# Patient Record
Sex: Female | Born: 1995 | Race: White | Hispanic: No | Marital: Single
Health system: Southern US, Community
[De-identification: ages and names within clinical notes are randomized; demographics above are authoritative.]

---

## 2017-02-14 ENCOUNTER — Encounter (HOSPITAL_COMMUNITY): Payer: Self-pay | Admitting: Emergency Medicine

## 2017-02-14 ENCOUNTER — Emergency Department (HOSPITAL_COMMUNITY): Payer: BLUE CROSS/BLUE SHIELD

## 2017-02-14 ENCOUNTER — Emergency Department (HOSPITAL_COMMUNITY)
Admission: EM | Admit: 2017-02-14 | Discharge: 2017-02-14 | Disposition: A | Discharge status: Home / Self Care | Payer: BLUE CROSS/BLUE SHIELD | Attending: Emergency Medicine | Admitting: Emergency Medicine

## 2017-02-14 DIAGNOSIS — S6992XA Unspecified injury of left wrist, hand and finger(s), initial encounter: Secondary | ICD-10-CM | POA: Diagnosis present

## 2017-02-14 DIAGNOSIS — S62617A Displaced fracture of proximal phalanx of left little finger, initial encounter for closed fracture: Secondary | ICD-10-CM | POA: Diagnosis not present

## 2017-02-14 DIAGNOSIS — Y999 Unspecified external cause status: Secondary | ICD-10-CM | POA: Diagnosis not present

## 2017-02-14 DIAGNOSIS — Y9389 Activity, other specified: Secondary | ICD-10-CM | POA: Insufficient documentation

## 2017-02-14 DIAGNOSIS — Y9289 Other specified places as the place of occurrence of the external cause: Secondary | ICD-10-CM | POA: Diagnosis not present

## 2017-02-14 DIAGNOSIS — W228XXA Striking against or struck by other objects, initial encounter: Secondary | ICD-10-CM | POA: Diagnosis not present

## 2017-02-14 MED ORDER — HYDROCODONE-ACETAMINOPHEN 5-325 MG PO TABS: 1.0000 | ORAL_TABLET | Freq: Four times a day (QID) | ORAL | 0 refills | Status: AC | PRN

## 2017-02-14 MED ORDER — HYDROCODONE-ACETAMINOPHEN 5-325 MG PO TABS
1.0000 | ORAL_TABLET | Freq: Once | ORAL | Status: AC
  Administered 2017-02-14: 1 via ORAL
  Filled 2017-02-14: qty 1

## 2017-02-14 MED ORDER — IBUPROFEN 400 MG PO TABS
600.0000 mg | ORAL_TABLET | Freq: Once | ORAL | Status: AC
  Administered 2017-02-14: 600 mg via ORAL
  Filled 2017-02-14: qty 2

## 2017-02-14 MED ORDER — IBUPROFEN 600 MG PO TABS: 600.0000 mg | ORAL_TABLET | Freq: Four times a day (QID) | ORAL | 0 refills | Status: AC | PRN

## 2017-02-14 NOTE — Discharge Instructions (Signed)
Elevate your finger. Use ice packs to keep the swelling down. Wear the finger splint to support your fracture. You need to see an orthopedist this week to reevaluate your fracture, you may need surgery to help it heal properly. You have a "Minimally displaced, oblique, extra-articular fracture of the left fifth proximal phalanx. Take the ibuprofen for pain, use the hydrocodone for severe pain (keeping you awake at night).

## 2017-02-14 NOTE — ED Provider Notes (Addendum)
[facility] DEPT Provider Note   CSN: [id number] Arrival date & time: 02/14/17  4098  Time seen 03:50 AM   History   Chief Complaint Chief Complaint  Patient presents with  . Finger Injury    HPI Marionette Mcmillen is a 21 y.o. female.  HPI  patient states around midnight she was in the pool. She states someone was afraid of the water and was coming down the water slide and they were at the end to try to catch her and somehow she got her left little finger injury. She states she is right-handed. She states she has some tingling in her left finger. She states it's swollen and it hurts when she tries to move it. She denies any other injury.  PCP none Family Orthopedist Dr Abran Duke in [place], [place]  History reviewed. No pertinent past medical history.  There are no active problems to display for this patient.   History reviewed. No pertinent surgical history.  OB History    No data available       Home Medications    Prior to Admission medications   Medication Sig Start Date End Date Taking? Authorizing Provider  HYDROcodone-acetaminophen (NORCO/VICODIN) 5-325 MG tablet Take 1 tablet by mouth every 6 (six) hours as needed for severe pain. 02/14/17   Devoria Albe, MD  ibuprofen (ADVIL,MOTRIN) 600 MG tablet Take 1 tablet (600 mg total) by mouth every 6 (six) hours as needed. 02/14/17   Devoria Albe, MD    Family History No family history on file.  Social History Social History  Substance Use Topics  . Smoking status: Not on file  . Smokeless tobacco: Not on file  . 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   Review of Systems Review of Systems  All other systems reviewed and are negative.    Physical Exam Updated Vital Signs BP 127/86 (BP Location: Left Arm)   Pulse 97   Temp 97.6 F (36.4 C) (Oral)   Resp 18   SpO2 100%   Physical Exam    Constitutional: She is oriented to person, place, and time. She appears well-developed and well-nourished.  HENT:  Head: Normocephalic and atraumatic.  Right Ear: External ear normal.  Left Ear: External ear normal.  Nose: Nose normal.  Eyes: Conjunctivae and EOM are normal.  Neck: Normal range of motion.  Cardiovascular: Normal rate.   Pulmonary/Chest: Effort normal.  Musculoskeletal: She exhibits edema, tenderness and deformity.  Patient has obvious diffuse swelling of her left little finger with some bruising on the volar aspect near the MCP joint. She prefers to keep her finger flexed but she is able to obtain almost full extension slowly. Sensation is intact, pulses are intact. Capillary refills normal.  Neurological: She is alert and oriented to person, place, and time. No cranial nerve deficit.  Skin: Skin is warm and dry.  Psychiatric: She has a normal mood and affect. Her behavior is normal. Thought content normal.     ED Treatments / Results  Labs (all labs ordered are listed, but only abnormal results are displayed) Labs Reviewed - No data to display  EKG  EKG Interpretation None       Radiology Dg Hand Complete Left  Result Date: 02/14/2017 CLINICAL DATA:  Left little finger injury EXAM: LEFT HAND - COMPLETE 3+ VIEW COMPARISON:  None. FINDINGS: There are is an oblique, extra-articular  fracture of the left fifth proximal phalanx with associated soft tissue swelling. There is slight medial displacement. IMPRESSION: Minimally displaced, oblique, extra-articular fracture of the left fifth proximal phalanx. Electronically Signed   By: Deatra RobinsonKevin  Herman M.D.   On: 02/14/2017 04:18    Procedures Procedures (including critical care time)  Medications Ordered in ED Medications  HYDROcodone-acetaminophen (NORCO/VICODIN) 5-325 MG per tablet 1 tablet (not administered)  ibuprofen (ADVIL,MOTRIN) tablet 600 mg (not administered)     Initial Impression / Assessment and Plan /  ED Course  I have reviewed the triage vital signs and the nursing notes.  Pertinent labs & imaging results that were available during my care of the patient were reviewed by me and considered in my medical decision making (see chart for details).  We discussed patient's x-ray results. I'm concerned that this is an oblique fracture and there is some displacement, she may need further evaluation by a orthopedist and possibly pin placement to stabilize the fracture. Family has a orthopedists in [place], I also gave them the number for a hand specialist. She was placed in a finger splint, she is to keep it elevated and use ice packs. She was given hydrocodone for pain however she was advised to take the ibuprofen mainly for her pain control and use the hydrocodone just for pain that is keeping her awake at night.   Please note that patient and her mother disagreed about her going back to work. Patient wants to go to work with limited duty, her mother wants her to stay out of work. Patient was given a note for limited duty at work.  Final Clinical Impressions(s) / ED Diagnoses   Final diagnoses:  Closed displaced fracture of proximal phalanx of left little finger, initial encounter    New Prescriptions New Prescriptions   HYDROCODONE-ACETAMINOPHEN (NORCO/VICODIN) 5-325 MG TABLET    Take 1 tablet by mouth every 6 (six) hours as needed for severe pain.   IBUPROFEN (ADVIL,MOTRIN) 600 MG TABLET    Take 1 tablet (600 mg total) by mouth every 6 (six) hours as needed.    Plan discharge  Devoria AlbeIva Kielee Care, MD, [facility] PyoFACEP    Delvecchio Madole, MD 02/14/17 [id number]    Devoria AlbeKnapp, Prue Lingenfelter, MD 02/14/17 [phone]

## 2017-02-14 NOTE — ED Triage Notes (Signed)
Pt C/O of a finger injury to her left pinky finger. Pt states she was "catching one of her friends coming down the slide in her pool and hit her finger on something." No deformity noted.

## 2017-09-01 IMAGING — DX DG HAND COMPLETE 3+V*L*
3 series · 3 of 3 positions shown · non-contrast
Comparison: None.

CLINICAL DATA: Left little finger injury

EXAM:
LEFT HAND - COMPLETE 3+ VIEW

[hand pa]
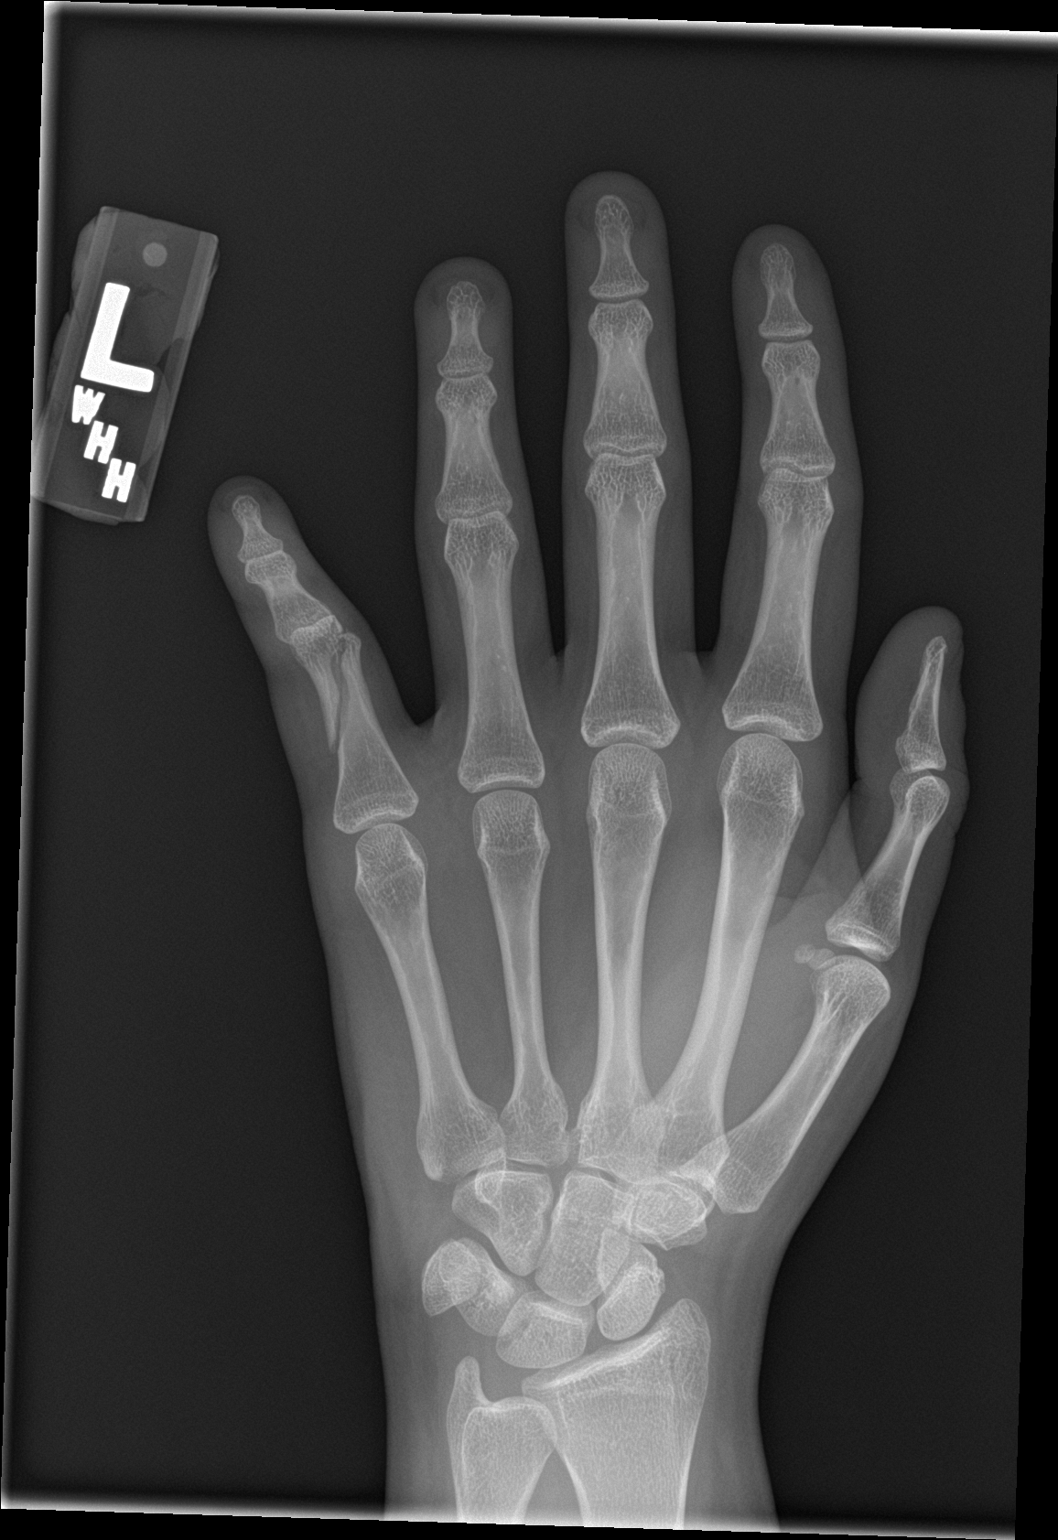

[hand obl]
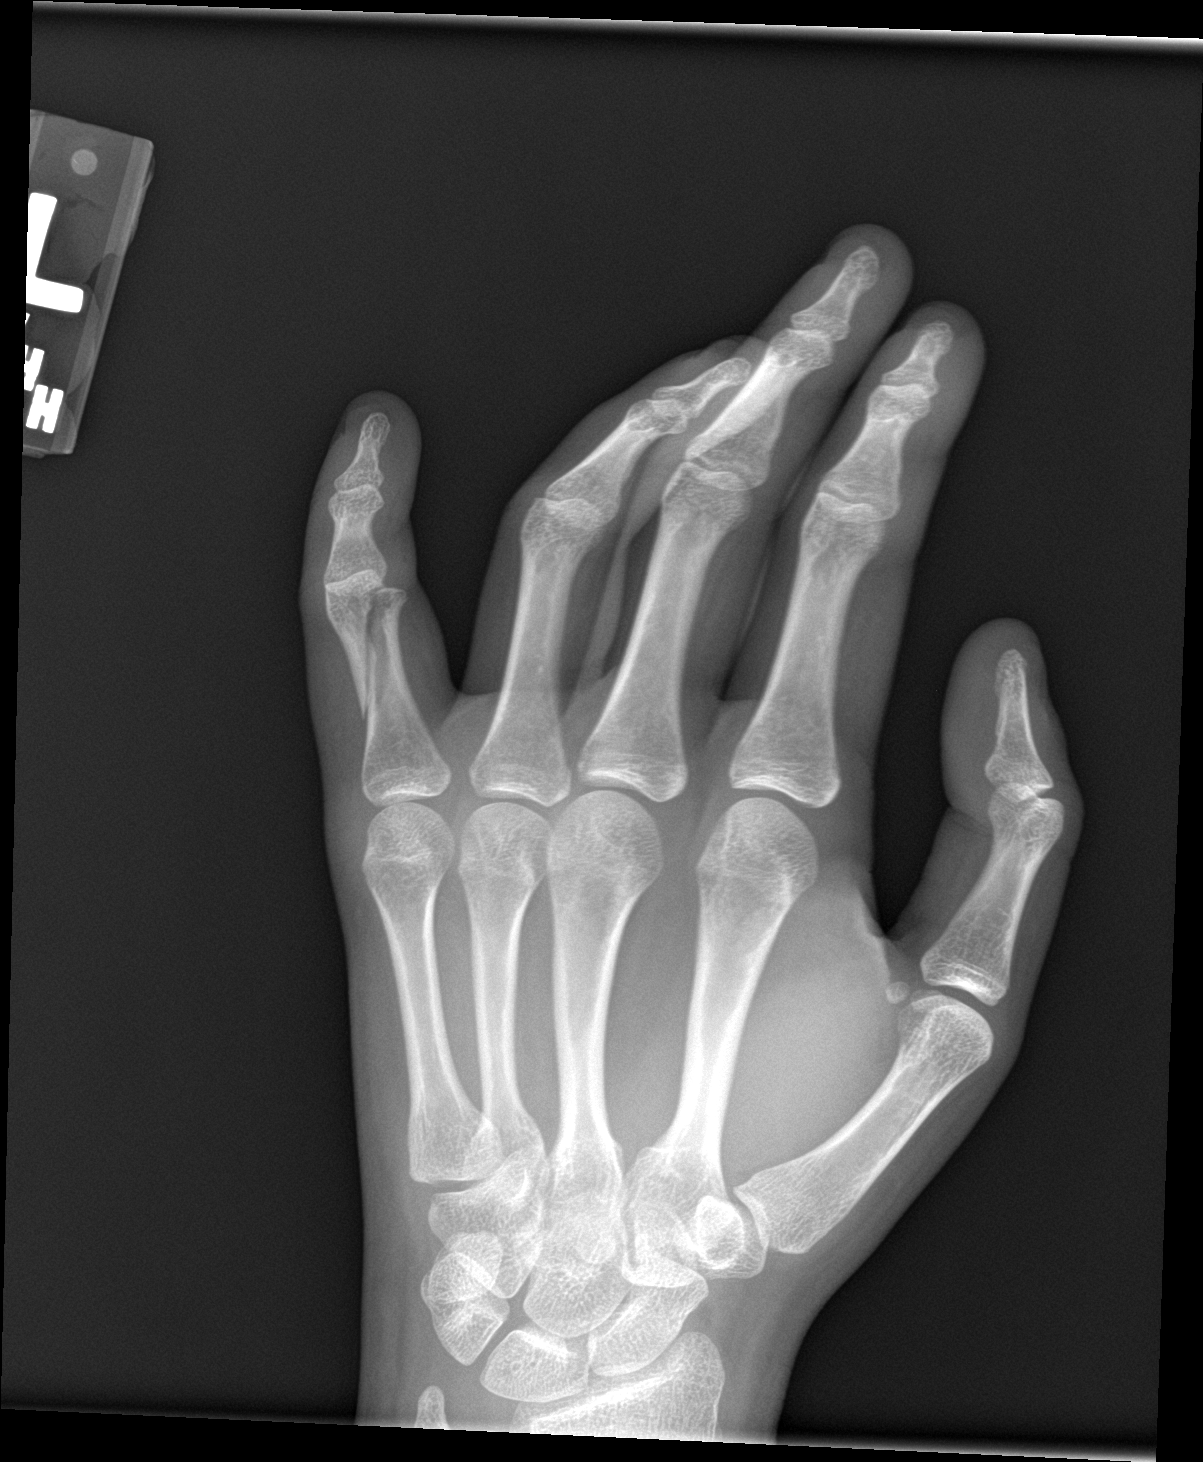

[hand lat]
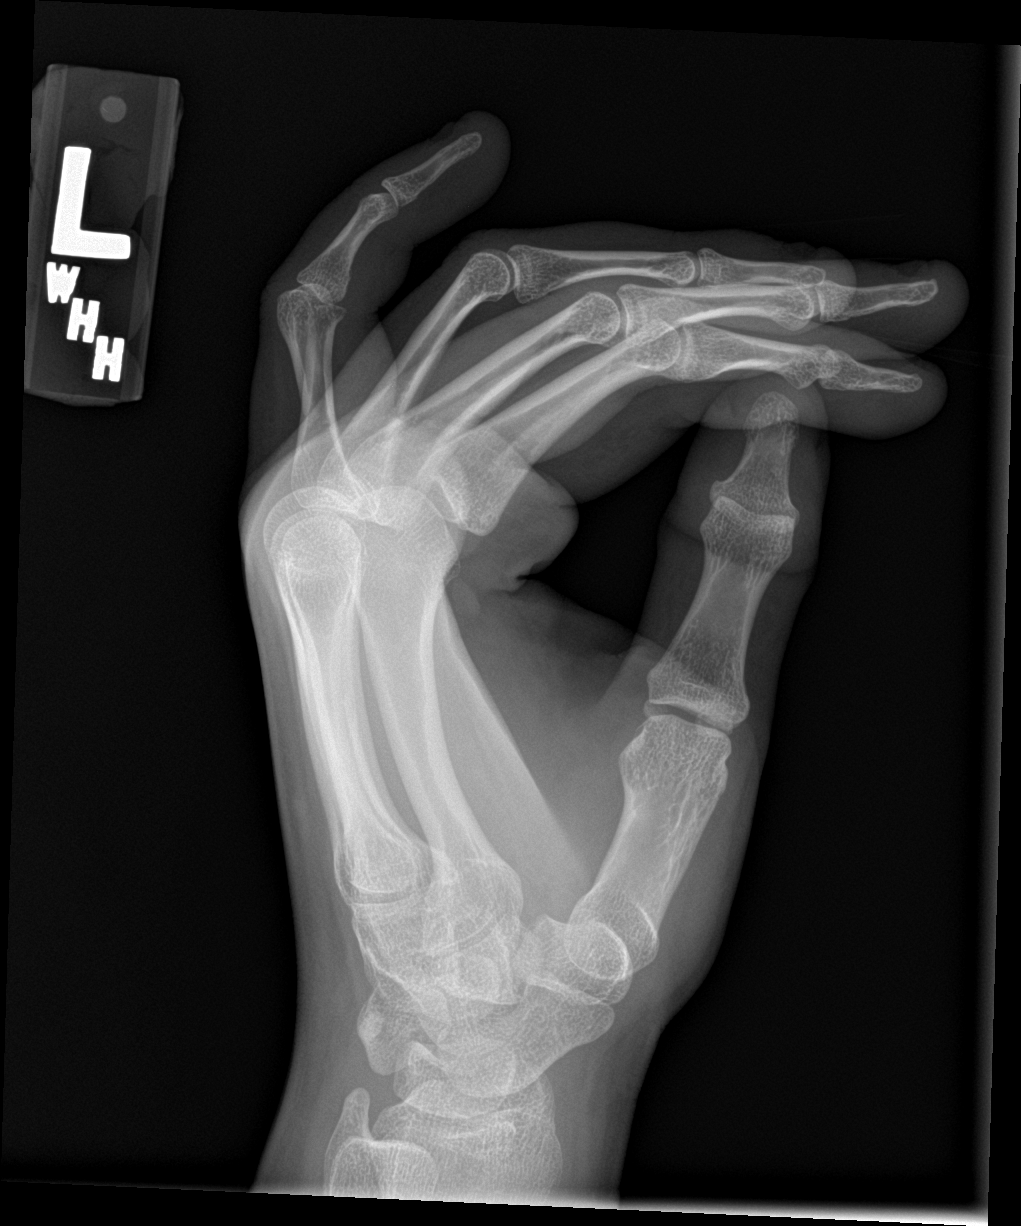

[3 of 3 positions shown; findings below may reference images not displayed]

FINDINGS: There are is an oblique, extra-articular fracture of the left fifth
proximal phalanx with associated soft tissue swelling. There is
slight medial displacement.
IMPRESSION: Minimally displaced, oblique, extra-articular fracture of the left
fifth proximal phalanx.
